# Patient Record
Sex: Male | Born: 1975 | Race: White | Hispanic: No | Marital: Married | Smoking: Former smoker
Health system: Southern US, Community
[De-identification: ages and names within clinical notes are randomized; demographics above are authoritative.]

## PROBLEM LIST (undated history)

## (undated) DIAGNOSIS — F4310 Post-traumatic stress disorder, unspecified: Secondary | ICD-10-CM

## (undated) DIAGNOSIS — N200 Calculus of kidney: Secondary | ICD-10-CM

## (undated) HISTORY — DX: Post-traumatic stress disorder, unspecified: F43.10

## (undated) HISTORY — PX: BLADDER STONE REMOVAL: SHX568

## (undated) HISTORY — PX: CYSTOSCOPY: SUR368

## (undated) HISTORY — PX: VASECTOMY: SHX75

## (undated) HISTORY — PX: KNEE ARTHROSCOPY: SUR90

---

## 2005-03-02 ENCOUNTER — Emergency Department (HOSPITAL_COMMUNITY): Admission: EM | Admit: 2005-03-02 | Discharge: 2005-03-02 | Payer: Self-pay | Admitting: Emergency Medicine

## 2006-08-29 ENCOUNTER — Emergency Department (HOSPITAL_COMMUNITY): Admission: EM | Admit: 2006-08-29 | Discharge: 2006-08-29 | Payer: Self-pay | Admitting: Emergency Medicine

## 2007-09-18 ENCOUNTER — Encounter: Admission: RE | Admit: 2007-09-18 | Discharge: 2007-10-08 | Payer: Self-pay | Admitting: Internal Medicine

## 2007-10-19 ENCOUNTER — Ambulatory Visit (HOSPITAL_COMMUNITY): Admission: RE | Admit: 2007-10-19 | Discharge: 2007-10-19 | Payer: Self-pay | Admitting: Internal Medicine

## 2008-09-04 ENCOUNTER — Emergency Department (HOSPITAL_COMMUNITY): Admission: EM | Admit: 2008-09-04 | Discharge: 2008-09-05 | Payer: Self-pay | Admitting: Emergency Medicine

## 2008-11-03 ENCOUNTER — Emergency Department (HOSPITAL_BASED_OUTPATIENT_CLINIC_OR_DEPARTMENT_OTHER): Admission: EM | Admit: 2008-11-03 | Discharge: 2008-11-03 | Payer: Self-pay | Admitting: Emergency Medicine

## 2008-11-03 ENCOUNTER — Ambulatory Visit: Payer: Self-pay | Admitting: Diagnostic Radiology

## 2009-06-04 ENCOUNTER — Emergency Department (HOSPITAL_COMMUNITY): Admission: EM | Admit: 2009-06-04 | Discharge: 2009-06-05 | Payer: Self-pay | Admitting: Emergency Medicine

## 2010-04-24 ENCOUNTER — Emergency Department (HOSPITAL_COMMUNITY): Admission: EM | Admit: 2010-04-24 | Discharge: 2010-04-24 | Payer: Self-pay | Admitting: Emergency Medicine

## 2011-08-24 LAB — BASIC METABOLIC PANEL
BUN: 15 mg/dL (ref 6–23)
CO2: 29 mEq/L (ref 19–32)
Calcium: 9.7 mg/dL (ref 8.4–10.5)
Creatinine, Ser: 0.9 mg/dL (ref 0.4–1.5)
Glucose, Bld: 118 mg/dL — ABNORMAL HIGH (ref 70–99)
Sodium: 142 mEq/L (ref 135–145)

## 2011-08-24 LAB — URINE MICROSCOPIC-ADD ON

## 2011-08-24 LAB — URINALYSIS, ROUTINE W REFLEX MICROSCOPIC
Bilirubin Urine: NEGATIVE
Specific Gravity, Urine: 1.022 (ref 1.005–1.030)
Urobilinogen, UA: 1 mg/dL (ref 0.0–1.0)

## 2013-09-05 ENCOUNTER — Encounter (HOSPITAL_BASED_OUTPATIENT_CLINIC_OR_DEPARTMENT_OTHER): Payer: Self-pay | Admitting: Emergency Medicine

## 2013-09-05 ENCOUNTER — Emergency Department (HOSPITAL_BASED_OUTPATIENT_CLINIC_OR_DEPARTMENT_OTHER)
Admission: EM | Admit: 2013-09-05 | Discharge: 2013-09-05 | Disposition: A | Discharge status: Home / Self Care | Payer: Self-pay | Attending: Emergency Medicine | Admitting: Emergency Medicine

## 2013-09-05 DIAGNOSIS — N23 Unspecified renal colic: Secondary | ICD-10-CM | POA: Insufficient documentation

## 2013-09-05 DIAGNOSIS — Z87891 Personal history of nicotine dependence: Secondary | ICD-10-CM | POA: Insufficient documentation

## 2013-09-05 DIAGNOSIS — Z79899 Other long term (current) drug therapy: Secondary | ICD-10-CM | POA: Insufficient documentation

## 2013-09-05 DIAGNOSIS — Z87442 Personal history of urinary calculi: Secondary | ICD-10-CM | POA: Insufficient documentation

## 2013-09-05 DIAGNOSIS — N509 Disorder of male genital organs, unspecified: Secondary | ICD-10-CM | POA: Insufficient documentation

## 2013-09-05 HISTORY — DX: Calculus of kidney: N20.0

## 2013-09-05 LAB — URINALYSIS, ROUTINE W REFLEX MICROSCOPIC
Glucose, UA: NEGATIVE mg/dL
Leukocytes, UA: NEGATIVE
Protein, ur: NEGATIVE mg/dL
Urobilinogen, UA: 1 mg/dL (ref 0.0–1.0)

## 2013-09-05 LAB — URINE MICROSCOPIC-ADD ON

## 2013-09-05 MED ORDER — HYDROCODONE-ACETAMINOPHEN 5-325 MG PO TABS: 1.0000 | ORAL_TABLET | ORAL | Status: DC | PRN

## 2013-09-05 MED ORDER — ONDANSETRON HCL 4 MG PO TABS: 4.0000 mg | ORAL_TABLET | Freq: Four times a day (QID) | ORAL | Status: DC

## 2013-09-05 MED ORDER — ONDANSETRON HCL 4 MG/2ML IJ SOLN
4.0000 mg | Freq: Once | INTRAMUSCULAR | Status: AC
Start: 2013-09-05 — End: 2013-09-05
  Administered 2013-09-05: 4 mg via INTRAVENOUS
  Filled 2013-09-05: qty 2

## 2013-09-05 MED ORDER — HYDROMORPHONE HCL PF 1 MG/ML IJ SOLN
0.5000 mg | Freq: Once | INTRAMUSCULAR | Status: AC
  Administered 2013-09-05: 0.5 mg via INTRAVENOUS
  Filled 2013-09-05: qty 1

## 2013-09-05 NOTE — ED Provider Notes (Signed)
Medical screening examination/treatment/procedure(s) were conducted as a shared visit with non-physician practitioner(s) or resident  and myself.  I personally evaluated the patient during the encounter and agree with the findings and plan unless otherwise indicated.  I have reviewed any xrays and/ or EKG's with the provider and I agree with interpretation.   Right flank pain. Stone hx.  Abd soft/ NT.  Well appearing. Passed stone in ED.  Fup discussed. Kidney stones  Enid Skeens, MD 09/05/13 [phone]

## 2013-09-05 NOTE — ED Provider Notes (Signed)
CSN: [id number]     Arrival date & time 09/05/13  1351 History   First MD Initiated Contact with Patient 09/05/13 1405     Chief Complaint  Patient presents with  . Flank Pain   (Consider location/radiation/quality/duration/timing/severity/associated sxs/prior Treatment) Patient is a 37 y.o. male presenting with flank pain. The history is provided by the patient and the spouse. No language interpreter was used.  Flank Pain This is a new problem. The current episode started today. The problem occurs constantly. Pertinent negatives include no chills, fever, nausea or vomiting. Associated symptoms comments: Sudden onset flank pain on right approximately 2 hours prior to arrival. No fever, N, V. He has a history of kidney stones and feels these are the same symptoms. The pain radiates into right scrotum. Marland Kitchen    Past Medical History  Diagnosis Date  . Kidney stone    History reviewed. No pertinent past surgical history. No family history on file. History  Substance Use Topics  . Smoking status: Former Games developer  . Smokeless tobacco: Not on file  . Alcohol Use: Not on file    Review of Systems  Constitutional: Negative for fever and chills.  Respiratory: Negative.  Negative for shortness of breath.   Gastrointestinal: Negative.  Negative for nausea and vomiting.  Genitourinary: Positive for flank pain and testicular pain. Negative for hematuria.  Musculoskeletal: Negative.   Skin: Negative.   Neurological: Negative.     Allergies  Review of patient's allergies indicates no known allergies.  Home Medications   Current Outpatient Rx  Name  Route  Sig  Dispense  Refill  . FLUoxetine (PROZAC) 40 MG capsule   Oral   Take 40 mg by mouth daily.         . meloxicam (MOBIC) 15 MG tablet   Oral   Take 15 mg by mouth daily.          BP 142/72  Pulse 86  Temp(Src) 98.7 F (37.1 C) (Oral)  Resp 18  SpO2 100% Physical Exam  Constitutional: He is oriented to person, place, and  time. He appears well-developed and well-nourished.  HENT:  Head: Normocephalic.  Neck: Normal range of motion. Neck supple.  Cardiovascular: Normal rate and regular rhythm.   Pulmonary/Chest: Effort normal and breath sounds normal.  Abdominal: Soft. Bowel sounds are normal. There is no tenderness. There is no rebound and no guarding.  Genitourinary:  No significant right CVA tenderness.   Musculoskeletal: Normal range of motion.  Neurological: He is alert and oriented to person, place, and time.  Skin: Skin is warm and dry. No rash noted.  Psychiatric: He has a normal mood and affect.    ED Course  Procedures (including critical care time) Labs Review Labs Reviewed  URINALYSIS, ROUTINE W REFLEX MICROSCOPIC   Results for orders placed during the hospital encounter of 09/05/13  URINALYSIS, ROUTINE W REFLEX MICROSCOPIC      Result Value Range   Color, Urine YELLOW  YELLOW   APPearance CLEAR  CLEAR   Specific Gravity, Urine 1.027  1.005 - 1.030   pH 6.0  5.0 - 8.0   Glucose, UA NEGATIVE  NEGATIVE mg/dL   Hgb urine dipstick LARGE (*) NEGATIVE   Bilirubin Urine NEGATIVE  NEGATIVE   Ketones, ur 15 (*) NEGATIVE mg/dL   Protein, ur NEGATIVE  NEGATIVE mg/dL   Urobilinogen, UA 1.0  0.0 - 1.0 mg/dL   Nitrite NEGATIVE  NEGATIVE   Leukocytes, UA NEGATIVE  NEGATIVE  URINE MICROSCOPIC-ADD ON  Result Value Range   WBC, UA 0-2  <3 WBC/hpf   RBC / HPF 21-50  <3 RBC/hpf   Bacteria, UA FEW (*) RARE   Urine-Other MUCOUS PRESENT      Imaging Review No results found.  EKG Interpretation   None       MDM  No diagnosis found. 1. Ureteral stone  The patient passed a stone while urinating in the ED. Pain is improved/resolved. No vomiting. VSS. Stable for discharge.     Arnoldo Hooker, PA-C 09/05/13 1606

## 2013-09-05 NOTE — ED Provider Notes (Signed)
Medical screening examination/treatment/procedure(s) were conducted as a shared visit with non-physician practitioner(s) or resident and myself. I personally evaluated the patient during the encounter and agree with the findings and plan unless otherwise indicated. I have reviewed any xrays and/ or EKG's with the provider and I agree with interpretation.  Right flank pain. Stone hx. Abd soft/ NT. Well appearing. Passed stone in ED.  Fup discussed.  Kidney stones   Enid Skeens, MD 09/05/13 [phone]

## 2013-09-05 NOTE — ED Notes (Signed)
Patient here with right flank pain x 1 hour, reports unable to urinate since the pain started. Remote hx of stones, denies trauma

## 2013-09-05 NOTE — ED Notes (Signed)
Warm Blankets provided to Patient and family.

## 2018-09-01 ENCOUNTER — Other Ambulatory Visit: Payer: Self-pay | Admitting: Physician Assistant

## 2018-09-01 ENCOUNTER — Ambulatory Visit
Admission: RE | Admit: 2018-09-01 | Discharge: 2018-09-01 | Disposition: A | Discharge status: Home / Self Care | Payer: PRIVATE HEALTH INSURANCE | Source: Ambulatory Visit | Attending: Physician Assistant | Admitting: Physician Assistant

## 2018-09-01 DIAGNOSIS — M5432 Sciatica, left side: Secondary | ICD-10-CM

## 2020-03-23 IMAGING — CR DG LUMBAR SPINE COMPLETE 4+V
5 series · 5 of 5 positions shown · non-contrast
Comparison: KUB January 04, 2010 an abdominal CT scan June 05, 2009

CLINICAL DATA: Two month history of left-sided low back pain

EXAM:
LUMBAR SPINE - COMPLETE 4+ VIEW

[w lumbar spine ap]
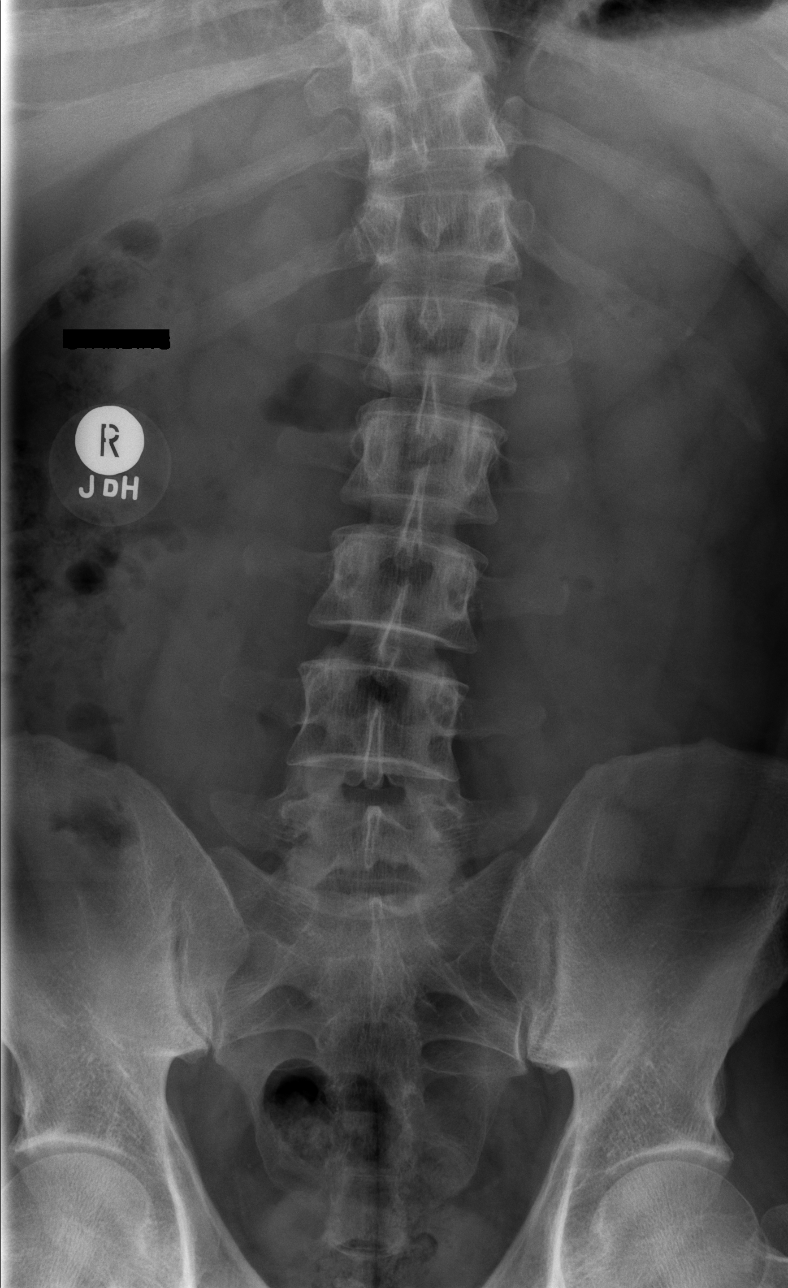

[w lumbar spine obl (1 of 2)]
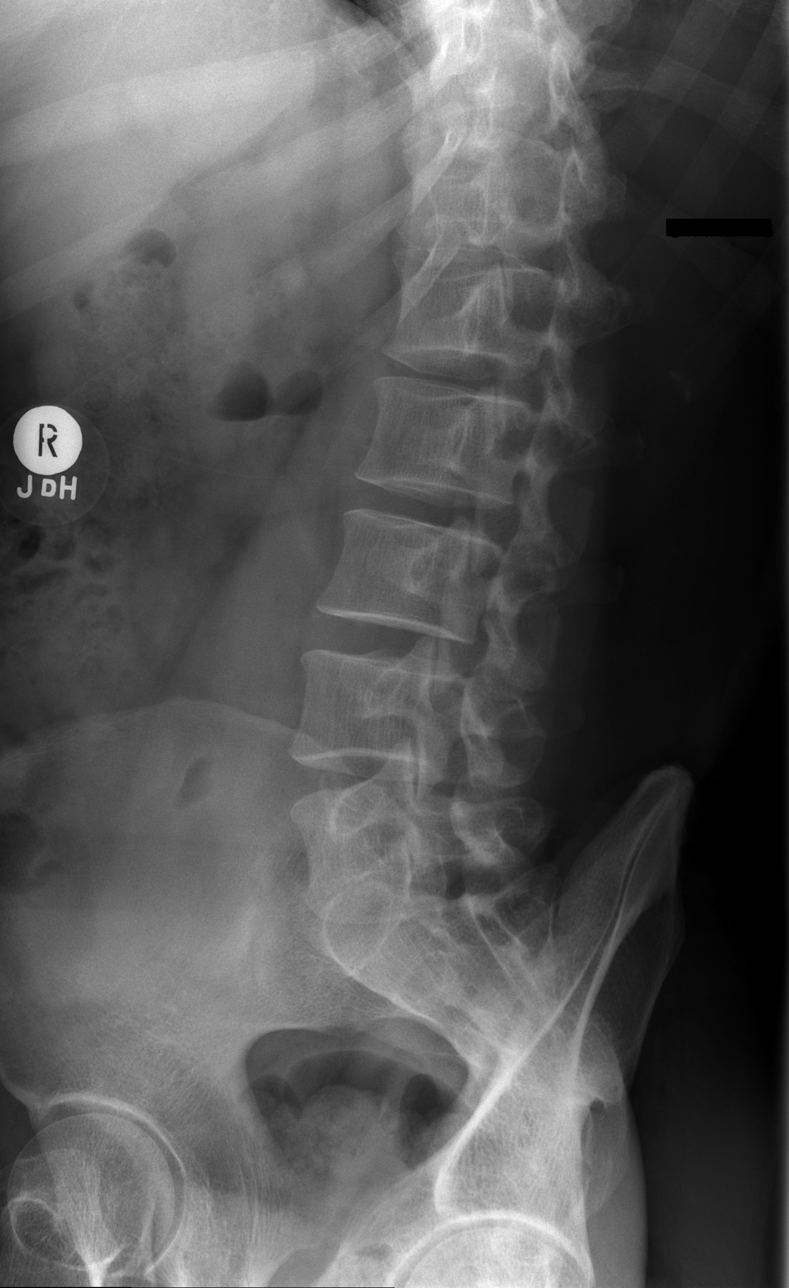

[w lumbar spine obl (2 of 2)]
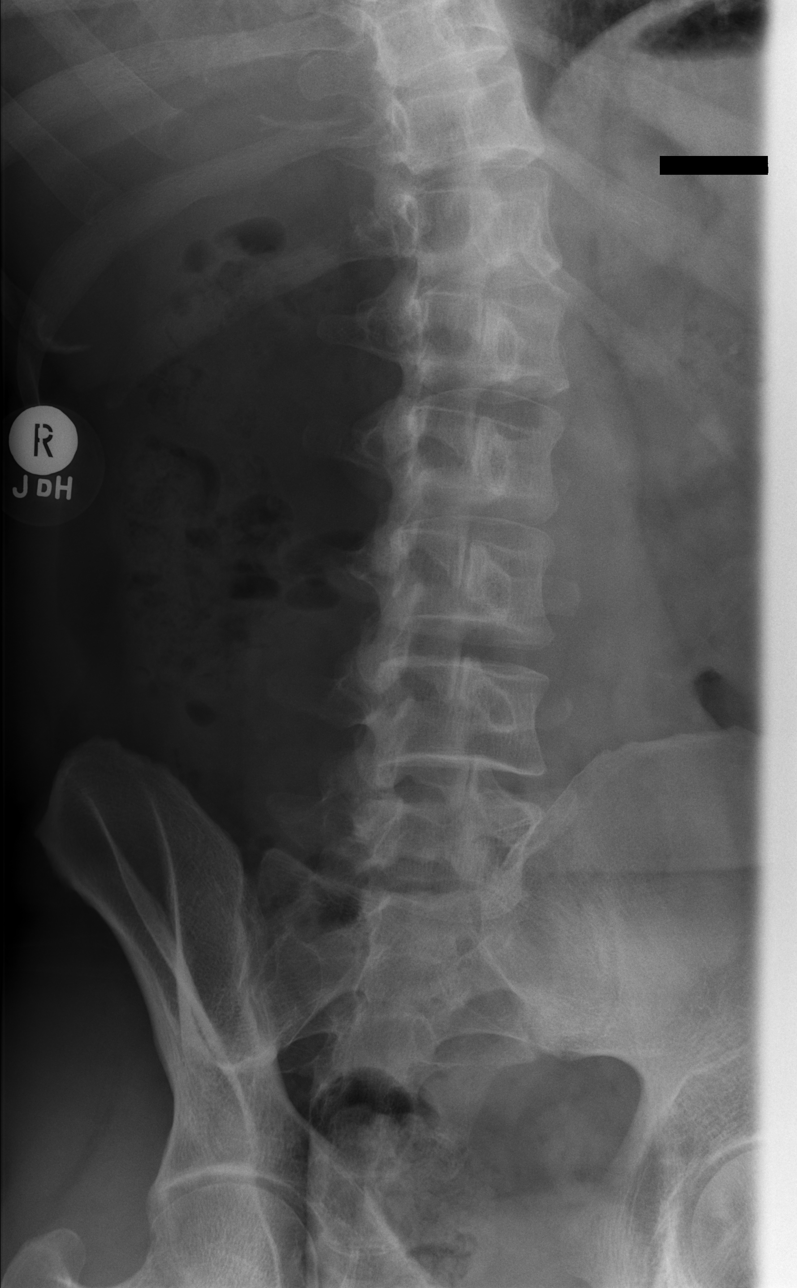

[w lumbar spine lat]
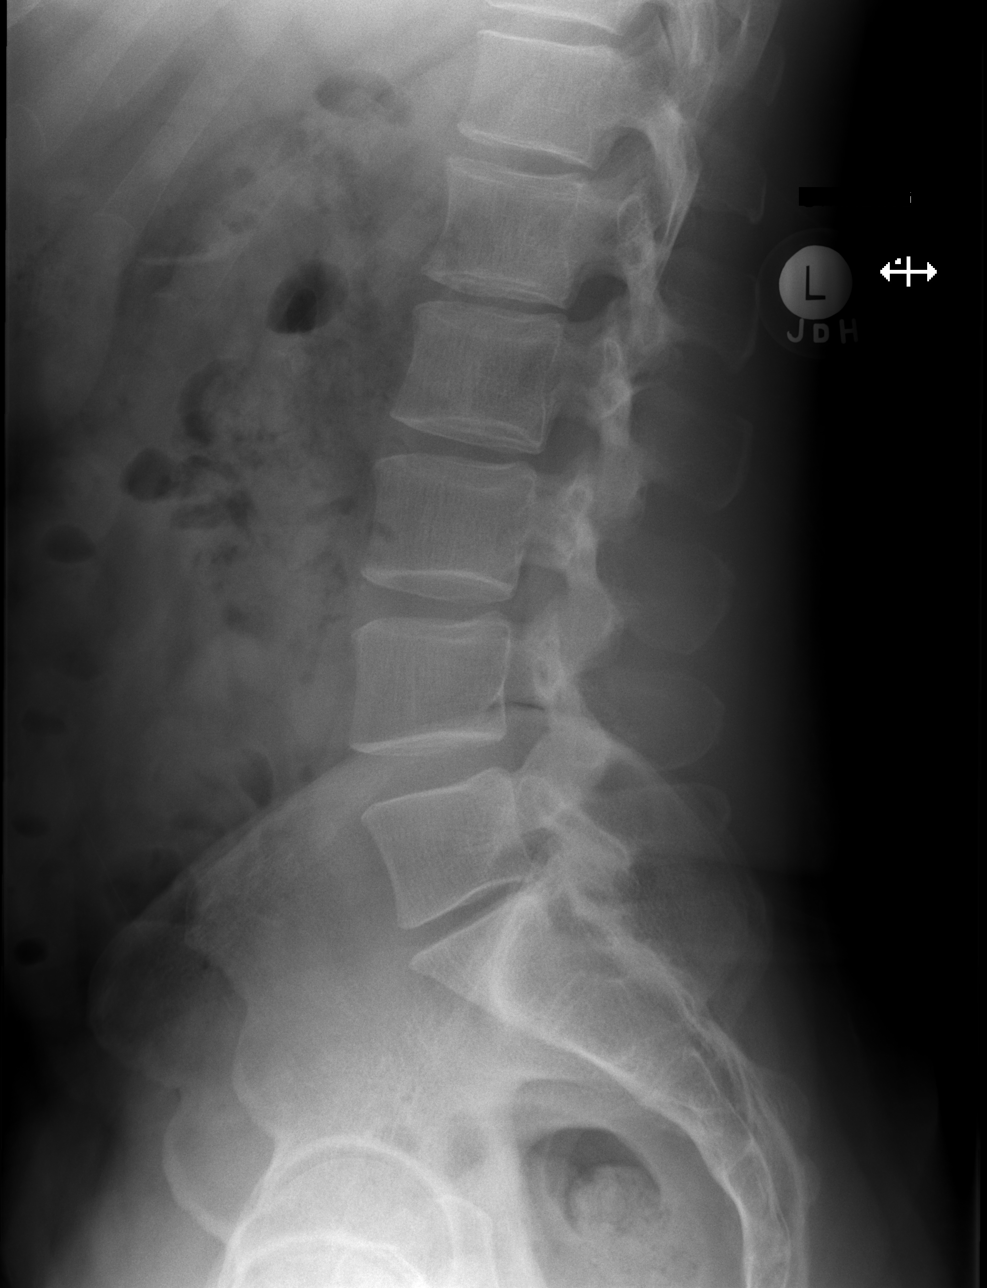

[w lumbar l-5 s-1 spot]
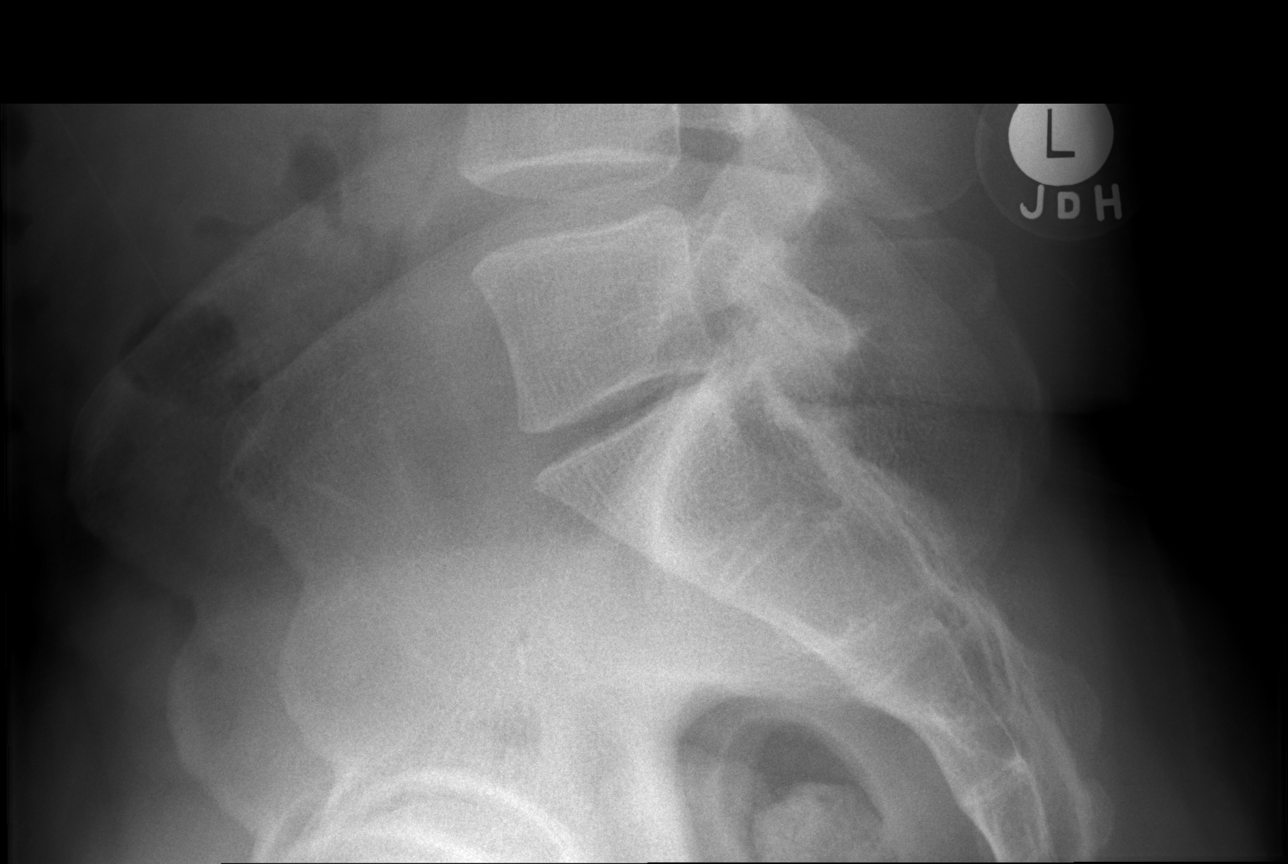

[5 of 5 positions shown; findings below may reference images not displayed]

FINDINGS: There is curvature centered at T12-L1 convex to the left which is
not a new finding though it is more conspicuous than in the past.
The lumbar vertebral bodies are preserved in height. The pedicles
and transverse processes appear intact. There is mild disc space
narrowing at L5-S1 and to a lesser extent at L1-L2. There is no
spondylolisthesis nor significant facet joint hypertrophy.
IMPRESSION: Chronic curvature of the thoracolumbar spine convex toward the left
centered at T12-L1.

No compression fracture or spondylolisthesis.

Mild disc space narrowing at L1-2 and at L4-5.

## 2021-07-12 ENCOUNTER — Emergency Department (HOSPITAL_COMMUNITY)
Admission: EM | Admit: 2021-07-12 | Discharge: 2021-07-12 | Disposition: A | Discharge status: Home / Self Care | Payer: Non-veteran care | Attending: Physician Assistant | Admitting: Physician Assistant

## 2021-07-12 ENCOUNTER — Emergency Department (HOSPITAL_COMMUNITY): Payer: Non-veteran care

## 2021-07-12 ENCOUNTER — Encounter (HOSPITAL_COMMUNITY): Payer: Self-pay | Admitting: *Deleted

## 2021-07-12 ENCOUNTER — Other Ambulatory Visit: Payer: Self-pay

## 2021-07-12 DIAGNOSIS — R0789 Other chest pain: Secondary | ICD-10-CM | POA: Insufficient documentation

## 2021-07-12 DIAGNOSIS — R55 Syncope and collapse: Secondary | ICD-10-CM | POA: Diagnosis not present

## 2021-07-12 DIAGNOSIS — Z5321 Procedure and treatment not carried out due to patient leaving prior to being seen by health care provider: Secondary | ICD-10-CM | POA: Diagnosis not present

## 2021-07-12 DIAGNOSIS — R531 Weakness: Secondary | ICD-10-CM | POA: Diagnosis not present

## 2021-07-12 DIAGNOSIS — R42 Dizziness and giddiness: Secondary | ICD-10-CM | POA: Insufficient documentation

## 2021-07-12 LAB — CBC WITH DIFFERENTIAL/PLATELET
Abs Immature Granulocytes: 0.05 10*3/uL (ref 0.00–0.07)
Basophils Absolute: 0.1 10*3/uL (ref 0.0–0.1)
Basophils Relative: 1 %
Eosinophils Absolute: 0.4 10*3/uL (ref 0.0–0.5)
Eosinophils Relative: 5 %
HCT: 47.5 % (ref 39.0–52.0)
Hemoglobin: 16.2 g/dL (ref 13.0–17.0)
Immature Granulocytes: 1 %
Lymphocytes Relative: 16 %
Lymphs Abs: 1.4 10*3/uL (ref 0.7–4.0)
MCH: 30.2 pg (ref 26.0–34.0)
MCHC: 34.1 g/dL (ref 30.0–36.0)
MCV: 88.6 fL (ref 80.0–100.0)
Monocytes Absolute: 0.7 10*3/uL (ref 0.1–1.0)
Monocytes Relative: 8 %
Neutro Abs: 6.2 10*3/uL (ref 1.7–7.7)
Neutrophils Relative %: 69 %
Platelets: 282 10*3/uL (ref 150–400)
RBC: 5.36 MIL/uL (ref 4.22–5.81)
RDW: 12.3 % (ref 11.5–15.5)
WBC: 8.8 10*3/uL (ref 4.0–10.5)
nRBC: 0 % (ref 0.0–0.2)

## 2021-07-12 LAB — TROPONIN I (HIGH SENSITIVITY)
Troponin I (High Sensitivity): 6 ng/L (ref <18)
Troponin I (High Sensitivity): 5 ng/L (ref <18)

## 2021-07-12 LAB — COMPREHENSIVE METABOLIC PANEL
ALT: 21 U/L (ref 0–44)
AST: 30 U/L (ref 15–41)
Albumin: 4 g/dL (ref 3.5–5.0)
Alkaline Phosphatase: 91 U/L (ref 38–126)
Anion gap: 11 (ref 5–15)
BUN: 17 mg/dL (ref 6–20)
CO2: 24 mmol/L (ref 22–32)
Calcium: 9.5 mg/dL (ref 8.9–10.3)
Chloride: 100 mmol/L (ref 98–111)
Creatinine, Ser: 1.26 mg/dL — ABNORMAL HIGH (ref 0.61–1.24)
GFR, Estimated: >60 mL/min (ref >60)
Glucose, Bld: 154 mg/dL — ABNORMAL HIGH (ref 70–99)
Potassium: 3.7 mmol/L (ref 3.5–5.1)
Sodium: 135 mmol/L (ref 135–145)
Total Bilirubin: 0.7 mg/dL (ref 0.3–1.2)
Total Protein: 7.2 g/dL (ref 6.5–8.1)

## 2021-07-12 LAB — CBG MONITORING, ED: Glucose-Capillary: 110 mg/dL — ABNORMAL HIGH (ref 70–99)

## 2021-07-12 NOTE — ED Provider Notes (Signed)
Emergency Medicine Provider Triage Evaluation Note  Ethan Bryant , a 45 y.o. male  was evaluated in triage.  Pt complains of near syncope while driving with his wife in the car.  Reports he began to feel everything spinning, felt lightheaded, felt like he was going to collapse.  There was also some chest tightness when this episode occurred.  He does not have any prior history of CAD, does have a family history of CAD with his father having an MI in his early 26s.  Does take Prozac daily for PTSD.  Reports having breakfast this morning.  Review of Systems  Positive: Lightheaded, dizzy, chest pain, weakness Negative: Nausea, vomiting, headache  Physical Exam  SpO2 98%  Gen:   Awake, no distress   Resp:  Normal effort  MSK:   Moves extremities without difficulty  Other:    Medical Decision Making  Medically screening exam initiated at 10:33 AM.  Appropriate orders placed.  Marylene Buerger was informed that the remainder of the evaluation will be completed by another provider, this initial triage assessment does not replace that evaluation, and the importance of remaining in the ED until their evaluation is complete.  Patient here with near syncope with a precedent of chest tightness.  No prior history of CAD, non-smoker.  Does have strong family history of cardiac disease.  Limited due to patient's symptoms at this time.   Claude Manges, PA-C 07/12/21 1038    Mancel Bale, MD 07/12/21 [phone]

## 2021-08-14 ENCOUNTER — Encounter (HOSPITAL_BASED_OUTPATIENT_CLINIC_OR_DEPARTMENT_OTHER): Payer: Self-pay | Admitting: Emergency Medicine

## 2021-08-14 ENCOUNTER — Emergency Department (HOSPITAL_BASED_OUTPATIENT_CLINIC_OR_DEPARTMENT_OTHER)
Admission: EM | Admit: 2021-08-14 | Discharge: 2021-08-14 | Disposition: A | Discharge status: Home / Self Care | Payer: No Typology Code available for payment source | Attending: Emergency Medicine | Admitting: Emergency Medicine

## 2021-08-14 ENCOUNTER — Other Ambulatory Visit: Payer: Self-pay

## 2021-08-14 ENCOUNTER — Emergency Department (HOSPITAL_BASED_OUTPATIENT_CLINIC_OR_DEPARTMENT_OTHER): Payer: No Typology Code available for payment source | Admitting: Radiology

## 2021-08-14 DIAGNOSIS — R059 Cough, unspecified: Secondary | ICD-10-CM | POA: Diagnosis present

## 2021-08-14 DIAGNOSIS — Z87891 Personal history of nicotine dependence: Secondary | ICD-10-CM | POA: Diagnosis not present

## 2021-08-14 DIAGNOSIS — J40 Bronchitis, not specified as acute or chronic: Secondary | ICD-10-CM | POA: Diagnosis not present

## 2021-08-14 MED ORDER — ALBUTEROL SULFATE HFA 108 (90 BASE) MCG/ACT IN AERS
2.0000 | INHALATION_SPRAY | RESPIRATORY_TRACT | Status: DC | PRN
  Administered 2021-08-14: 2 via RESPIRATORY_TRACT
  Filled 2021-08-14: qty 6.7

## 2021-08-14 MED ORDER — PREDNISONE 50 MG PO TABS
60.0000 mg | ORAL_TABLET | Freq: Once | ORAL | Status: AC
  Administered 2021-08-14: 60 mg via ORAL
  Filled 2021-08-14: qty 1

## 2021-08-14 MED ORDER — PREDNISONE 10 MG PO TABS
ORAL_TABLET | ORAL | 0 refills | Status: DC
  Filled 2021-08-14: qty 21, 6d supply, fill #0

## 2021-08-14 NOTE — ED Provider Notes (Signed)
[facility] EMERGENCY DEPARTMENT Provider Note  CSN: [id number] Arrival date & time: 08/14/21 1533    History Chief Complaint  Patient presents with   Cough    Ethan Bryant is a 45 y.o. male with no significant PMH reports URI symptoms for the last week or so. In the last two days he has begun coughing up green mucous, feeling tired having some wheezing. No reported fever. Home Covid test was neg. Does not smoke.    Past Medical History:  Diagnosis Date   Kidney stone     History reviewed. No pertinent surgical history.  History reviewed. No pertinent family history.  Social History   Tobacco Use   Smoking status: Former     Home Medications Prior to Admission medications   Medication Sig Start Date End Date Taking? Authorizing Provider  predniSONE (STERAPRED UNI-PAK 21 TAB) 10 MG (21) TBPK tablet 10mg  Tabs, 6 day taper. Use as directed 08/14/21  Yes 08/16/21, MD  FLUoxetine (PROZAC) 40 MG capsule Take 40 mg by mouth daily.    [provider]  meloxicam (MOBIC) 15 MG tablet Take 15 mg by mouth daily.    [provider]     Allergies    Patient has no known allergies.   Review of Systems   Review of Systems A comprehensive review of systems was completed and negative except as noted in HPI.    Physical Exam BP 118/79 (BP Location: Right Arm)   Pulse 76   Temp 98 F (36.7 C) (Temporal)   Resp 16   Ht 5\' 6"  (1.676 m)   Wt 93 kg   SpO2 98%   BMI 33.09 kg/m   Physical Exam Vitals and nursing note reviewed.  Constitutional:      Appearance: Normal appearance.  HENT:     Head: Normocephalic and atraumatic.     Nose: Nose normal.     Mouth/Throat:     Mouth: Mucous membranes are moist.  Eyes:     Extraocular Movements: Extraocular movements intact.     Conjunctiva/sclera: Conjunctivae normal.  Cardiovascular:     Rate and Rhythm: Normal rate.  Pulmonary:     Effort: Pulmonary effort is normal. No  respiratory distress.     Breath sounds: Rhonchi present.  Abdominal:     General: Abdomen is flat.     Palpations: Abdomen is soft.     Tenderness: There is no abdominal tenderness.  Musculoskeletal:        General: No swelling. Normal range of motion.     Cervical back: Neck supple.  Skin:    General: Skin is warm and dry.  Neurological:     General: No focal deficit present.     Mental Status: He is alert.  Psychiatric:        Mood and Affect: Mood normal.     ED Results / Procedures / Treatments   Labs (all labs ordered are listed, but only abnormal results are displayed) Labs Reviewed - No data to display  EKG None   Radiology DG Chest 2 View  Result Date: 08/14/2021 CLINICAL DATA:  Cough and short of breath EXAM: CHEST - 2 VIEW COMPARISON:  None. FINDINGS: Normal mediastinum and cardiac silhouette. Normal pulmonary vasculature. No evidence of effusion, infiltrate, or pneumothorax. No acute bony abnormality. Sigmoid scoliosis again noted IMPRESSION: No acute cardiopulmonary process. Electronically Signed   By: M.D.   On: 08/14/2021 17:30    Procedures Procedures  Medications Ordered in the ED Medications  albuterol (VENTOLIN HFA) 108 (90 Base) MCG/ACT inhaler 2 puff (2 puffs Inhalation Given 08/14/21 2030)  predniSONE (DELTASONE) tablet 60 mg (has no administration in time range)     MDM Rules/Calculators/A&P MDM Patient with recent URI, now settled into his chest. He has normal vitals, no distress. Some rhonchi but CXR is clear. Will give 2 puffs of albuterol, anticipate discharge with Rx for pred-pak as well. PCP follow up.   ED Course  I have reviewed the triage vital signs and the nursing notes.  Pertinent labs & imaging results that were available during my care of the patient were reviewed by me and considered in my medical decision making (see chart for details).     Final Clinical Impression(s) / ED Diagnoses Final diagnoses:   Bronchitis    Rx / DC Orders ED Discharge Orders          Ordered    predniSONE (STERAPRED UNI-PAK 21 TAB) 10 MG (21) TBPK tablet        08/14/21 2047             Pollyann Savoy, MD 08/14/21 2048

## 2021-08-14 NOTE — ED Triage Notes (Signed)
Pt arrives to ED with c/o of cough x7 days. Coughing up green mucous. Pt reports today he began to become SOB and experienced wheezing. No fevers or chills. Pt tried Sudafed at home w/o relief.

## 2021-08-15 ENCOUNTER — Other Ambulatory Visit (HOSPITAL_BASED_OUTPATIENT_CLINIC_OR_DEPARTMENT_OTHER): Payer: Self-pay

## 2021-10-11 ENCOUNTER — Ambulatory Visit: Payer: 59 | Admitting: Neurology

## 2021-10-11 ENCOUNTER — Encounter: Payer: Self-pay | Admitting: Neurology

## 2021-10-11 ENCOUNTER — Other Ambulatory Visit: Payer: Self-pay

## 2021-10-11 VITALS — BP 124/77 | HR 79 | Ht 66.0 in | Wt 208.0 lb

## 2021-10-11 DIAGNOSIS — F419 Anxiety disorder, unspecified: Secondary | ICD-10-CM

## 2021-10-11 DIAGNOSIS — G3184 Mild cognitive impairment, so stated: Secondary | ICD-10-CM | POA: Diagnosis not present

## 2021-10-11 DIAGNOSIS — F4310 Post-traumatic stress disorder, unspecified: Secondary | ICD-10-CM

## 2021-10-11 NOTE — Progress Notes (Signed)
[facility]  PATIENT: Ethan Bryant DOB: 1976-11-17  REQUESTING CLINICIAN: Heywood Bene, * HISTORY FROM: Patient  REASON FOR VISIT: Memory problems    HISTORICAL  CHIEF COMPLAINT:  Chief Complaint  Patient presents with   New Patient (Initial Visit)    Rm 13. Alone. 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 Pt c/o of memory issues for past ten years. 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 He described issues with short term memory, hard time when speaking, word finding difficulties,  sometimes in the middle of conversation, all thought process will go away, he will forget what he wants to say mid sentence. He also reports misplacing items on occasion.  Does not remember recent conversations, sometimes will forget people names that he just just met.  Wife agrees that memory is getting worse  Currently drives, no accident, but reports getting loss in familiar places, happens a dozen time Wife takes care of all the finances, she has always done so.   Patient reports this is the worse year of his life: oldest daughter is struggling on and off with drugs, she graduated high school in April, she moved out, staying with her mother. Currently having problem with wife, almost got a divorce, been together for 14 years, there is also reports of money issues. Patient reports that things are getting better in the past 3 months, getting counseling with wife. 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[place], [place]  Has PTSD and currently seeing a psychiatrist, therapist and counselor   He Exercises daily, get about 5 to 5 and 1/2 hours of sleep, uses a cpap machine    Grandfather with dementia, diagnosed in his 13s Mental history on mothers side of the family   OTHER MEDICAL CONDITIONS: PTSD, Anxiety, Concussions,    REVIEW OF SYSTEMS: Full 14 system review of systems performed and negative with exception of: as noted in the HPI  ALLERGIES: No Known Allergies  HOME MEDICATIONS: Outpatient Medications Prior to Visit  Medication Sig Dispense Refill   FLUoxetine (PROZAC) 40 MG capsule Take 40 mg by mouth daily.     meloxicam (MOBIC) 15 MG tablet Take 15 mg by mouth daily.     predniSONE (DELTASONE) 10 MG tablet Take 6 tablets by mouth on day 1, 5 tabs on day 2, 4 tabs on day 3, 3 tabs on day 4, 2 tabs on day 5, 1 tab on day 6 21 tablet 0   No facility-administered medications prior to visit.    PAST MEDICAL HISTORY: Past Medical History:  Diagnosis Date   Kidney stone    PTSD (post-traumatic stress disorder)     PAST SURGICAL HISTORY: Past Surgical History:  Procedure Laterality Date   BLADDER STONE REMOVAL     CYSTOSCOPY     KNEE ARTHROSCOPY     VASECTOMY      FAMILY HISTORY: Family History  Problem Relation Age of Onset   Breast cancer Mother    Diabetes Father    Heart attack Father    Parkinsonism Maternal Grandfather     SOCIAL HISTORY: Social History   Socioeconomic History   Marital status: Married    Spouse name:  Not on file   Number of children: Not on file   Years of education: Not on file   Highest education level: Not on file  Occupational History   Not on file  Tobacco Use   Smoking status: Former   Smokeless tobacco: Not on file  Substance and Sexual Activity   Alcohol use: Not on file   Drug use: Not on file   Sexual activity: Not on file  Other Topics Concern   Not on file  Social History Narrative   Not on file   Social Determinants of Health   Financial Resource Strain: Not on file  Food Insecurity: Not on file  Transportation Needs: Not on file  Physical Activity: Not on file  Stress:  Not on file  Social Connections: Not on file  Intimate Partner Violence: Not on file    PHYSICAL EXAM  GENERAL EXAM/CONSTITUTIONAL: Vitals:  Vitals:   10/11/21 1032  BP: 124/77  Pulse: 79  Weight: 208 lb (94.3 kg)  Height: 5' 6"  (1.676 m)   Body mass index is 33.57 kg/m. Wt Readings from Last 3 Encounters:  10/11/21 208 lb (94.3 kg)  08/14/21 205 lb (93 kg)   Patient is in no distress; well developed, nourished and groomed; neck is supple  CARDIOVASCULAR: Examination of carotid arteries is normal; no carotid bruits Regular rate and rhythm, no murmurs Examination of peripheral vascular system by observation and palpation is normal  EYES: Pupils round and reactive to light, Visual fields full to confrontation, Extraocular movements intacts,   MUSCULOSKELETAL: Gait, strength, tone, movements noted in Neurologic exam below  NEUROLOGIC: MENTAL STATUS:  MMSE - Cyrus Exam 10/11/2021  Orientation to time 5  Orientation to Place 5  Registration 3  Attention/ Calculation 5  Recall 3  Language- name 2 objects 2  Language- repeat 1  Language- follow 3 step command 3  Language- read & follow direction 1  Write a sentence 1  Copy design 1  Total score 30   awake, alert, oriented to person, place and time recent and remote memory intact normal attention and concentration language fluent, comprehension intact, naming intact fund of knowledge appropriate  CRANIAL NERVE:  2nd, 3rd, 4th, 6th - pupils equal and reactive to light, visual fields full to confrontation, extraocular muscles intact, no nystagmus 5th - facial sensation symmetric 7th - facial strength symmetric 8th - hearing intact 9th - palate elevates symmetrically, uvula midline 11th - shoulder shrug symmetric 12th - tongue protrusion midline  MOTOR:  normal bulk and tone, full strength in the BUE, BLE  SENSORY:  normal and symmetric to light touch, pinprick, temperature,  vibration  COORDINATION:  finger-nose-finger, fine finger movements normal  REFLEXES:  deep tendon reflexes present and symmetric  GAIT/STATION:  normal   DIAGNOSTIC DATA (LABS, IMAGING, TESTING) - I reviewed patient records, labs, notes, testing and imaging myself where available.  Lab Results  Component Value Date   WBC 8.8 07/12/2021   HGB 16.2 07/12/2021   HCT 47.5 07/12/2021   MCV 88.6 07/12/2021   PLT 282 07/12/2021      Component Value Date/Time   NA 135 07/12/2021 1120   K 3.7 07/12/2021 1120   CL 100 07/12/2021 1120   CO2 24 07/12/2021 1120   GLUCOSE 154 (H) 07/12/2021 1120   BUN 17 07/12/2021 1120   CREATININE 1.26 (H) 07/12/2021 1120   CALCIUM 9.5 07/12/2021 1120   PROT 7.2 07/12/2021 1120   ALBUMIN 4.0 07/12/2021 1120  AST 30 07/12/2021 1120   ALT 21 07/12/2021 1120   ALKPHOS 91 07/12/2021 1120   BILITOT 0.7 07/12/2021 1120   GFRNONAA >60 07/12/2021 1120   GFRAA  11/03/2008 1859    >60        The eGFR has been calculated using the MDRD equation. This calculation has not been validated in all clinical   No results found for: CHOL, HDL, LDLCALC, LDLDIRECT, TRIG, CHOLHDL No results found for: HGBA1C No results found for: VITAMINB12 No results found for: TSH    ASSESSMENT AND PLAN  45 y.o. year old male with past medical history of PTSD, anxiety and depression who is presenting with memory complaint for the past 10 to 15 years.  Memory problem described as being forgetful, word finding difficulty, and misplacing items.  Patient does have a diagnosis of PTSD currently getting treatment with a psychiatrist and a therapist.  There is also stressor in his life, describing a difficult relationship with the older daughter who moved out of the house and currently with mother (ex-wife).  There is also marital issue with his current wife, they are getting counseling at present.  He reports a family history of dementia on his grandfather but he was diagnosed in  his 19s.  I explained to the patient that he is very young, he is mental status exam today showed a score of 30 out of 30, that I do not believe he has dementia.  However he has a diagnosis of PTSD, anxiety and depression and memory complaints are also found in patient with PTSD anxiety/depression.  There is also increased stress in his life.  I advised him to follow-up with a psychiatrist, therapist and counseling to manage his PTSD, anxiety/depression and in his marital issues.  He is on fluoxetine 40 mg daily and I told him that fluoxetine can cause sexual dysfunction.  He can follow-up with his psychiatrist to see if there is a better medication that he can use with lower sexual dysfunction side effect.  Continue current medication for now and follow-up in 1 year.   1. Mild cognitive impairment   2. PTSD (post-traumatic stress disorder)   3. Anxiety     PLAN: Continue current medications  Follow up in 1 year   No orders of the defined types were placed in this encounter.   No orders of the defined types were placed in this encounter.   Return in about 1 year (around 10/11/2022).    Alric Ran, MD 10/11/2021, 4:53 PM  [facility] [address], [place], [place] [address] [phone]

## 2021-10-11 NOTE — Patient Instructions (Signed)
Continue current medications  Follow up in 1 year      There are well-accepted and sensible ways to reduce risk for Alzheimers disease and other degenerative brain disorders .  Exercise Daily Walk A daily 20 minute walk should be part of your routine. Disease related apathy can be a significant roadblock to exercise and the only way to overcome this is to make it a daily routine and perhaps have a reward at the end (something your loved one loves to eat or drink perhaps) or a personal trainer coming to the home can also be very useful. Most importantly, the patient is much more likely to exercise if the caregiver / spouse does it with him/her. In general a structured, repetitive schedule is best.  General Health: Any diseases which effect your body will effect your brain such as a pneumonia, urinary infection, blood clot, heart attack or stroke. Keep contact with your primary care doctor for regular follow ups.  Sleep. A good nights sleep is healthy for the brain. Seven hours is recommended. If you have insomnia or poor sleep habits we can give you some instructions. If you have sleep apnea wear your mask.  Diet: Eating a heart healthy diet is also a good idea; fish and poultry instead of red meat, nuts (mostly non-peanuts), vegetables, fruits, olive oil or canola oil (instead of butter), minimal salt (use other spices to flavor foods), whole grain rice, bread, cereal and pasta and wine in moderation.Research is now showing that the MIND diet, which is a combination of The Mediterranean diet and the DASH diet, is beneficial for cognitive processing and longevity. Information about this diet can be found in The MIND Diet, a book by Alonna Minium, MS, RDN, and online at [email or web address]  Finances, Power of [address] and Advance Directives: You should consider putting legal safeguards in place with regard to financial and medical decision making. While the spouse always has  power of attorney for medical and financial issues in the absence of any form, you should consider what you want in case the spouse / caregiver is no longer around or capable of making decisions.       Heart-head connection  New research shows there are things we can do to reduce the risk of mild cognitive impairment and dementia.  Several conditions known to increase the risk of cardiovascular disease -- such as high blood pressure, diabetes and high cholesterol -- also increase the risk of developing Alzheimer's. Some autopsy studies show that as many as 80 percent of individuals with Alzheimer's disease also have cardiovascular disease.  A longstanding question is why some people develop hallmark Alzheimer's plaques and tangles but do not develop the symptoms of Alzheimer's. Vascular disease may help researchers eventually find an answer. Some autopsy studies suggest that plaques and tangles may be present in the brain without causing symptoms of cognitive decline unless the brain also shows evidence of vascular disease. More research is needed to better understand the link between vascular health and Alzheimer's.  Physical exercise and diet Regular physical exercise may be a beneficial strategy to lower the risk of Alzheimer's and vascular dementia. Exercise may directly benefit brain cells by increasing blood and oxygen flow in the brain. Because of its known cardiovascular benefits, a medically approved exercise program is a valuable part of any overall wellness plan.  Current evidence suggests that heart-healthy eating may also help protect the brain. Heart-healthy eating includes limiting the intake of sugar and saturated fats and making sure  to eat plenty of fruits, vegetables, and whole grains. No one diet is best. Two diets that have been studied and may be beneficial are the DASH (Dietary Approaches to Stop Hypertension) diet and the Mediterranean diet. The DASH diet emphasizes  vegetables, fruits and fat-free or low-fat dairy products; includes whole grains, fish, poultry, beans, seeds, nuts and vegetable oils; and limits sodium, sweets, sugary beverages and red meats. A Mediterranean diet includes relatively little red meat and emphasizes whole grains, fruits and vegetables, fish and shellfish, and nuts, olive oil and other healthy fats.  Social connections and intellectual activity A number of studies indicate that maintaining strong social connections and keeping mentally active as we age might lower the risk of cognitive decline and Alzheimer's. Experts are not certain about the reason for this association. It may be due to direct mechanisms through which social and mental stimulation strengthen connections between nerve cells in the brain.  Head trauma There appears to be a strong link between future risk of Alzheimer's and serious head trauma, especially when injury involves loss of consciousness. You can help reduce your risk of Alzheimer's by protecting your head.  Wear a seat belt  Use a helmet when participating in sports  "Fall-proof" your home   What you can do now While research is not yet conclusive, certain lifestyle choices, such as physical activity and diet, may help support brain health and prevent Alzheimer's. Many of these lifestyle changes have been shown to lower the risk of other diseases, like heart disease and diabetes, which have been linked to Alzheimer's. With few drawbacks and plenty of known benefits, healthy lifestyle choices can improve your health and possibly protect your brain.  Learn more about brain health. You can help increase our knowledge by considering participation in a clinical study. Our free clinical trial matching services, [employer], can help you find clinical trials in your area that are seeking volunteers.

## 2021-10-18 ENCOUNTER — Ambulatory Visit: Payer: No Typology Code available for payment source | Admitting: Neurology

## 2022-10-15 ENCOUNTER — Ambulatory Visit: Payer: No Typology Code available for payment source | Admitting: Neurology

## 2022-10-15 ENCOUNTER — Encounter: Payer: Self-pay | Admitting: Neurology
